# Patient Record
Sex: Male | Born: 1990 | Race: White | Hispanic: No | Marital: Single | Smoking: Never smoker
Health system: Southern US, Community
[De-identification: ages and names within clinical notes are randomized; demographics above are authoritative.]

## PROBLEM LIST (undated history)

## (undated) HISTORY — PX: WISDOM TOOTH EXTRACTION: SHX21

---
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
## 2016-09-28 ENCOUNTER — Emergency Department (HOSPITAL_COMMUNITY)
Admission: EM | Admit: 2016-09-28 | Discharge: 2016-09-28 | Disposition: A | Discharge status: Home / Self Care | Payer: Self-pay | Attending: Emergency Medicine | Admitting: Emergency Medicine

## 2016-09-28 ENCOUNTER — Emergency Department (HOSPITAL_COMMUNITY): Payer: Self-pay

## 2016-09-28 ENCOUNTER — Encounter (HOSPITAL_COMMUNITY): Payer: Self-pay | Admitting: Emergency Medicine

## 2016-09-28 DIAGNOSIS — R1031 Right lower quadrant pain: Secondary | ICD-10-CM | POA: Insufficient documentation

## 2016-09-28 LAB — CBC
HEMATOCRIT: 45.2 % (ref 39.0–52.0)
HEMOGLOBIN: 15.4 g/dL (ref 13.0–17.0)
MCH: 31.2 pg (ref 26.0–34.0)
MCHC: 34.1 g/dL (ref 30.0–36.0)
MCV: 91.5 fL (ref 78.0–100.0)
PLATELETS: 255 10*3/uL (ref 150–400)
RBC: 4.94 MIL/uL (ref 4.22–5.81)
RDW: 12.2 % (ref 11.5–15.5)
WBC: 10.3 10*3/uL (ref 4.0–10.5)

## 2016-09-28 LAB — URINALYSIS, ROUTINE W REFLEX MICROSCOPIC
Bilirubin Urine: NEGATIVE
GLUCOSE, UA: NEGATIVE mg/dL
Hgb urine dipstick: NEGATIVE
Ketones, ur: NEGATIVE mg/dL
LEUKOCYTES UA: NEGATIVE
Nitrite: NEGATIVE
PROTEIN: NEGATIVE mg/dL
SPECIFIC GRAVITY, URINE: 1.012 (ref 1.005–1.030)
pH: 7 (ref 5.0–8.0)

## 2016-09-28 LAB — LIPASE, BLOOD: LIPASE: 26 U/L (ref 11–51)

## 2016-09-28 LAB — COMPREHENSIVE METABOLIC PANEL
ALT: 20 U/L (ref 17–63)
ANION GAP: 9 (ref 5–15)
AST: 19 U/L (ref 15–41)
Albumin: 4.8 g/dL (ref 3.5–5.0)
Alkaline Phosphatase: 76 U/L (ref 38–126)
BUN: 8 mg/dL (ref 6–20)
CHLORIDE: 101 mmol/L (ref 101–111)
CO2: 26 mmol/L (ref 22–32)
Calcium: 9.5 mg/dL (ref 8.9–10.3)
Creatinine, Ser: 0.95 mg/dL (ref 0.61–1.24)
Glucose, Bld: 104 mg/dL — ABNORMAL HIGH (ref 65–99)
POTASSIUM: 4.2 mmol/L (ref 3.5–5.1)
SODIUM: 136 mmol/L (ref 135–145)
Total Bilirubin: 1 mg/dL (ref 0.3–1.2)
Total Protein: 7.8 g/dL (ref 6.5–8.1)

## 2016-09-28 MED ORDER — IOPAMIDOL (ISOVUE-300) INJECTION 61%
INTRAVENOUS | Status: AC
  Administered 2016-09-28: 100 mL
  Filled 2016-09-28: qty 100

## 2016-09-28 MED ORDER — SODIUM CHLORIDE 0.9 % IV BOLUS (SEPSIS)
1000.0000 mL | Freq: Once | INTRAVENOUS | Status: AC
  Administered 2016-09-28: 1000 mL via INTRAVENOUS

## 2016-09-28 NOTE — ED Provider Notes (Signed)
[facility] DEPT Provider Note   CSN: [id number] Arrival date & time: 09/28/16  1638     History   Chief Complaint Chief Complaint  Patient presents with  . Abdominal Pain  . Nausea    HPI Angel Kaiser is a 26 y.o. male.  Patient presents with persistent mild right lower quadrant pain since last night when he developed nausea. Patient so has mild nausea. No abdominal surgery history. No vomiting. No fever. No current urinary or testicular symptoms. No history of similar. Softer stool.      History reviewed. No pertinent past medical history.  There are no active problems to display for this patient.   Past Surgical History:  Procedure Laterality Date  . WISDOM TOOTH EXTRACTION         Home Medications    Prior to Admission medications   Not on File    Family History No family history on file.  Social History Social History  Substance Use Topics  . Smoking status: Never Smoker  . Smokeless tobacco: Never Used  . Alcohol use No     Allergies   Patient has no known allergies.   Review of Systems Review of Systems  Constitutional: Negative for chills and fever.  HENT: Negative for congestion.   Eyes: Negative for visual disturbance.  Respiratory: Negative for shortness of breath.   Cardiovascular: Negative for chest pain.  Gastrointestinal: Positive for abdominal pain and nausea. Negative for vomiting.  Genitourinary: Negative for dysuria and flank pain.  Musculoskeletal: Negative for back pain, neck pain and neck stiffness.  Skin: Negative for rash.  Neurological: Negative for light-headedness and headaches.     Physical Exam Updated Vital Signs BP 130/88   Pulse 92   Temp 98.2 F (36.8 C) (Oral)   Resp 18   Ht 6\' 3"  (1.905 m)   Wt 81.6 kg (180 lb)   SpO2 100%   BMI 22.50 kg/m   Physical Exam  Constitutional: He is oriented to person, place, and time. He appears well-developed and well-nourished.  HENT:  Head: Normocephalic  and atraumatic.  Eyes: Conjunctivae are normal. Right eye exhibits no discharge. Left eye exhibits no discharge.  Neck: Normal range of motion. Neck supple. No tracheal deviation present.  Cardiovascular: Normal rate and regular rhythm.   Pulmonary/Chest: Effort normal and breath sounds normal.  Abdominal: Soft. He exhibits no distension. There is tenderness (mild right lower quadrant no guarding). There is no guarding.  Musculoskeletal: He exhibits no edema.  Neurological: He is alert and oriented to person, place, and time.  Skin: Skin is warm. No rash noted.  Psychiatric: He has a normal mood and affect.  Nursing note and vitals reviewed.    ED Treatments / Results  Labs (all labs ordered are listed, but only abnormal results are displayed) Labs Reviewed  COMPREHENSIVE METABOLIC PANEL - Abnormal; Notable for the following:       Result Value   Glucose, Bld 104 (*)    All other components within normal limits  LIPASE, BLOOD  CBC  URINALYSIS, ROUTINE W REFLEX MICROSCOPIC    EKG  EKG Interpretation None       Radiology Ct Abdomen Pelvis W Contrast  Result Date: 09/28/2016 CLINICAL DATA:  Right lower quadrant pain with nausea and diarrhea. EXAM: CT ABDOMEN AND PELVIS WITH CONTRAST TECHNIQUE: Multidetector CT imaging of the abdomen and pelvis was performed using the standard protocol following bolus administration of intravenous contrast. CONTRAST:  ISOVUE-300 IOPAMIDOL (ISOVUE-300) INJECTION 61% COMPARISON:  None. FINDINGS: Lower chest: No acute abnormality. Hepatobiliary: No focal liver abnormality is seen. No gallstones, gallbladder wall thickening, or biliary dilatation. Pancreas: Unremarkable. No pancreatic ductal dilatation or surrounding inflammatory changes. Spleen: Normal in size without focal abnormality. Adrenals/Urinary Tract: Adrenal glands are unremarkable. Kidneys are normal, without renal calculi, focal lesion, or hydronephrosis. Bladder is unremarkable.  Stomach/Bowel: The stomach and small bowel are normal. The colon is normal. Mid appendix is mildly prominent in caliber measuring 7.3 mm. There is also fluid in the mid appendix. However, there is no adjacent stranding and the distal tip of the appendix is normal. No convincing evidence of appendicitis on today's study. Vascular/Lymphatic: No significant vascular findings are present. No enlarged abdominal or pelvic lymph nodes. Reproductive: Prostate is unremarkable. Other: No free air or free fluid. A fat containing umbilical hernia is identified. Musculoskeletal: No acute or significant osseous findings. IMPRESSION: 1. The mid appendix is mildly prominent in caliber measuring 7.3 mm but there is no adjacent stranding and no convincing evidence of appendicitis. 2. No other acute abnormalities there Electronically Signed   By: Gerome Samavid  Williams III M.D   On: 09/28/2016 19:54    Procedures Procedures (including critical care time)  Medications Ordered in ED Medications  sodium chloride 0.9 % bolus 1,000 mL (1,000 mLs Intravenous New Bag/Given 09/28/16 1905)  iopamidol (ISOVUE-300) 61 % injection (100 mLs  Contrast Given 09/28/16 1925)     Initial Impression / Assessment and Plan / ED Course  I have reviewed the triage vital signs and the nursing notes.  Pertinent labs & imaging results that were available during my care of the patient were reviewed by me and considered in my medical decision making (see chart for details).    Patient presents with right lower quadrant pain and nausea. Concern clinically for early appendicitis. CT scan showed mild dilation of mid appendix. General surgery consult  General surgery has low suspicion for appy, recommends recheck in 24 hrs in ED.  Patient comfortable with plan, he understands this may be early appendicitis.   Results and differential diagnosis were discussed with the patient/parent/guardian. Xrays were independently reviewed by myself.  Close  follow up outpatient was discussed, comfortable with the plan.   Medications  sodium chloride 0.9 % bolus 1,000 mL (0 mLs Intravenous Stopped 09/28/16 2012)  iopamidol (ISOVUE-300) 61 % injection (100 mLs  Contrast Given 09/28/16 1925)    Vitals:   09/28/16 1830 09/28/16 1845 09/28/16 1900 09/28/16 2000  BP: 137/81 126/79 130/88 126/84  Pulse: 100 92 92 94  Resp:   18 16  Temp:      TempSrc:      SpO2: 100% 99% 100% 100%  Weight:      Height:        Final diagnoses:  Right lower quadrant abdominal pain      Final Clinical Impressions(s) / ED Diagnoses   Final diagnoses:  Right lower quadrant abdominal pain    New Prescriptions New Prescriptions   No medications on file     Blane OharaZavitz, Lani, MD 10/01/16 [phone]

## 2016-09-28 NOTE — Discharge Instructions (Signed)
See a physician or return to the emergency department tomorrow for recheck for possible early appendicitis.   If you were given medicines take as directed.  If you are on coumadin or contraceptives realize their levels and effectiveness is altered by many different medicines.  If you have any reaction (rash, tongues swelling, other) to the medicines stop taking and see a physician.    Please follow up as directed and return to the ER or see a physician for new or worsening symptoms.  Thank you. Vitals:   09/28/16 1830 09/28/16 1845 09/28/16 1900 09/28/16 2000  BP: 137/81 126/79 130/88 126/84  Pulse: 100 92 92 94  Resp:   18 16  Temp:      TempSrc:      SpO2: 100% 99% 100% 100%  Weight:      Height:

## 2016-09-28 NOTE — Consult Note (Signed)
Reason for Consult:abd pain, ? appendicitis Referring Physician: Dr Reather Converse     Angel Kaiser is an 26 y.o. male.  HPI: 54 yom who has no medical history presents with onset of rlq pain at 2100 last night. Has some nausea.  Having normal bms although had a loose stool this am. No fevers. It does not hurt with movement and did not hurt in car ride over. Here because his mother made him come tonight.  He is voiding normally. He has no prior medical history.  He complains of some mild tenderness in his rlq.  He underwent a ct scan that is equivocal for appendicitis. There is mildly prominent appendix with some fluid but remainder is normal and there is not adjacent inflammatory process.  His wbc is normal.   History reviewed. No pertinent past medical history.  Past Surgical History:  Procedure Laterality Date  . WISDOM TOOTH EXTRACTION      No family history on file.  Social History:  reports that he has never smoked. He has never used smokeless tobacco. He reports that he does not drink alcohol or use drugs.  Allergies: No Known Allergies  Medications: I have reviewed the patient's current medications.  Results for orders placed or performed during the hospital encounter of 09/28/16 (from the past 48 hour(s))  Lipase, blood     Status: None   Collection Time: 09/28/16  4:51 PM  Result Value Ref Range   Lipase 26 11 - 51 U/L  Comprehensive metabolic panel     Status: Abnormal   Collection Time: 09/28/16  4:51 PM  Result Value Ref Range   Sodium 136 135 - 145 mmol/L   Potassium 4.2 3.5 - 5.1 mmol/L   Chloride 101 101 - 111 mmol/L   CO2 26 22 - 32 mmol/L   Glucose, Bld 104 (H) 65 - 99 mg/dL   BUN 8 6 - 20 mg/dL   Creatinine, Ser 0.95 0.61 - 1.24 mg/dL   Calcium 9.5 8.9 - 10.3 mg/dL   Total Protein 7.8 6.5 - 8.1 g/dL   Albumin 4.8 3.5 - 5.0 g/dL   AST 19 15 - 41 U/L   ALT 20 17 - 63 U/L   Alkaline Phosphatase 76 38 - 126 U/L   Total Bilirubin 1.0 0.3 - 1.2 mg/dL   GFR calc non Af  Amer >60 >60 mL/min   GFR calc Af Amer >60 >60 mL/min    Comment: (NOTE) The eGFR has been calculated using the CKD EPI equation. This calculation has not been validated in all clinical situations. eGFR's persistently <60 mL/min signify possible Chronic Kidney Disease.    Anion gap 9 5 - 15  CBC     Status: None   Collection Time: 09/28/16  4:51 PM  Result Value Ref Range   WBC 10.3 4.0 - 10.5 K/uL   RBC 4.94 4.22 - 5.81 MIL/uL   Hemoglobin 15.4 13.0 - 17.0 g/dL   HCT 45.2 39.0 - 52.0 %   MCV 91.5 78.0 - 100.0 fL   MCH 31.2 26.0 - 34.0 pg   MCHC 34.1 30.0 - 36.0 g/dL   RDW 12.2 11.5 - 15.5 %   Platelets 255 150 - 400 K/uL  Urinalysis, Routine w reflex microscopic     Status: None   Collection Time: 09/28/16  7:07 PM  Result Value Ref Range   Color, Urine YELLOW YELLOW   APPearance CLEAR CLEAR   Specific Gravity, Urine 1.012 1.005 - 1.030   pH  7.0 5.0 - 8.0   Glucose, UA NEGATIVE NEGATIVE mg/dL   Hgb urine dipstick NEGATIVE NEGATIVE   Bilirubin Urine NEGATIVE NEGATIVE   Ketones, ur NEGATIVE NEGATIVE mg/dL   Protein, ur NEGATIVE NEGATIVE mg/dL   Nitrite NEGATIVE NEGATIVE   Leukocytes, UA NEGATIVE NEGATIVE    Ct Abdomen Pelvis W Contrast  Result Date: 09/28/2016 CLINICAL DATA:  Right lower quadrant pain with nausea and diarrhea. EXAM: CT ABDOMEN AND PELVIS WITH CONTRAST TECHNIQUE: Multidetector CT imaging of the abdomen and pelvis was performed using the standard protocol following bolus administration of intravenous contrast. CONTRAST:  172m ISOVUE-300 IOPAMIDOL (ISOVUE-300) INJECTION 61% COMPARISON:  None. FINDINGS: Lower chest: No acute abnormality. Hepatobiliary: No focal liver abnormality is seen. No gallstones, gallbladder wall thickening, or biliary dilatation. Pancreas: Unremarkable. No pancreatic ductal dilatation or surrounding inflammatory changes. Spleen: Normal in size without focal abnormality. Adrenals/Urinary Tract: Adrenal glands are unremarkable. Kidneys are  normal, without renal calculi, focal lesion, or hydronephrosis. Bladder is unremarkable. Stomach/Bowel: The stomach and small bowel are normal. The colon is normal. Mid appendix is mildly prominent in caliber measuring 7.3 mm. There is also fluid in the mid appendix. However, there is no adjacent stranding and the distal tip of the appendix is normal. No convincing evidence of appendicitis on today's study. Vascular/Lymphatic: No significant vascular findings are present. No enlarged abdominal or pelvic lymph nodes. Reproductive: Prostate is unremarkable. Other: No free air or free fluid. A fat containing umbilical hernia is identified. Musculoskeletal: No acute or significant osseous findings. IMPRESSION: 1. The mid appendix is mildly prominent in caliber measuring 7.3 mm but there is no adjacent stranding and no convincing evidence of appendicitis. 2. No other acute abnormalities there Electronically Signed   By: DDorise BullionIII M.D   On: 09/28/2016 19:54    Review of Systems  Constitutional: Negative for chills and fever.  Gastrointestinal: Positive for abdominal pain and diarrhea. Negative for nausea and vomiting.  All other systems reviewed and are negative.  Blood pressure 126/84, pulse 94, temperature 98.2 F (36.8 C), temperature source Oral, resp. rate 16, height 6' 3" (1.905 m), weight 81.6 kg (180 lb), SpO2 100 %. Physical Exam  Vitals reviewed. Constitutional: He is oriented to person, place, and time. He appears well-developed and well-nourished. No distress.  HENT:  Head: Normocephalic and atraumatic.  Eyes: No scleral icterus.  Cardiovascular: Normal rate, regular rhythm and normal heart sounds.   Respiratory: Effort normal. He has no wheezes. He has no rales.  GI: Soft. Bowel sounds are normal. There is tenderness (minimal rlq tenderness to deep palpation).  Neurological: He is alert and oriented to person, place, and time.  Skin: Skin is warm and dry. He is not diaphoretic.     Assessment/Plan: rlq pain  I am not convinced he has appendicitis on exam and on what is really an equivocal ct scan. He has no fever and has normal wbc.  I discussed with he and his mother that he might have appendicitis though.  We discussed options of proceeding to lap appy now with decent chance of normal appendix that I would remove.  Other option given that he is just 24 hours into this is a recheck in am in the er.  I think that is reasonable given his overall picture and dont think at that point he would really risk rupture less than 36 hours into this if we proceed with that.  He is not really interested in taking risk of a negative surgery at  this point and I am not again convinced on exam he has appy.  We will plan on recheck in am by er.  I discussed fevers, worsening pain as reasons to return overnight tonight also.  Discussed plan with Dr Reather Converse.   Angel Kaiser,Angel Kaiser 09/28/2016, 8:51 PM

## 2016-09-28 NOTE — ED Notes (Signed)
Dr. Wakefield at bedside. 

## 2016-09-28 NOTE — ED Triage Notes (Signed)
Pt c/o abdominal pain with nausea onset last night at 9 pm. Pt denies change in diet.

## 2016-09-29 ENCOUNTER — Encounter (HOSPITAL_COMMUNITY): Payer: Self-pay

## 2016-09-29 ENCOUNTER — Emergency Department (HOSPITAL_COMMUNITY)
Admission: EM | Admit: 2016-09-29 | Discharge: 2016-09-29 | Disposition: A | Discharge status: Home / Self Care | Payer: Self-pay | Attending: Emergency Medicine | Admitting: Emergency Medicine

## 2016-09-29 ENCOUNTER — Emergency Department (HOSPITAL_COMMUNITY): Payer: Self-pay

## 2016-09-29 DIAGNOSIS — R1031 Right lower quadrant pain: Secondary | ICD-10-CM | POA: Insufficient documentation

## 2016-09-29 DIAGNOSIS — Z79899 Other long term (current) drug therapy: Secondary | ICD-10-CM | POA: Insufficient documentation

## 2016-09-29 LAB — COMPREHENSIVE METABOLIC PANEL
ALT: 22 U/L (ref 17–63)
ANION GAP: 10 (ref 5–15)
AST: 22 U/L (ref 15–41)
Albumin: 4.6 g/dL (ref 3.5–5.0)
Alkaline Phosphatase: 68 U/L (ref 38–126)
BUN: 9 mg/dL (ref 6–20)
CHLORIDE: 105 mmol/L (ref 101–111)
CO2: 24 mmol/L (ref 22–32)
Calcium: 9.7 mg/dL (ref 8.9–10.3)
Creatinine, Ser: 0.88 mg/dL (ref 0.61–1.24)
Glucose, Bld: 90 mg/dL (ref 65–99)
POTASSIUM: 4 mmol/L (ref 3.5–5.1)
SODIUM: 139 mmol/L (ref 135–145)
Total Bilirubin: 1 mg/dL (ref 0.3–1.2)
Total Protein: 7.9 g/dL (ref 6.5–8.1)

## 2016-09-29 LAB — CBC
HCT: 43.5 % (ref 39.0–52.0)
HEMOGLOBIN: 15.3 g/dL (ref 13.0–17.0)
MCH: 32.1 pg (ref 26.0–34.0)
MCHC: 35.2 g/dL (ref 30.0–36.0)
MCV: 91.4 fL (ref 78.0–100.0)
Platelets: 255 10*3/uL (ref 150–400)
RBC: 4.76 MIL/uL (ref 4.22–5.81)
RDW: 12.1 % (ref 11.5–15.5)
WBC: 7.9 10*3/uL (ref 4.0–10.5)

## 2016-09-29 LAB — LIPASE, BLOOD: LIPASE: 28 U/L (ref 11–51)

## 2016-09-29 MED ORDER — IOPAMIDOL (ISOVUE-300) INJECTION 61%
INTRAVENOUS | Status: AC
  Filled 2016-09-29: qty 100

## 2016-09-29 MED ORDER — IOPAMIDOL (ISOVUE-300) INJECTION 61%
INTRAVENOUS | Status: AC
  Administered 2016-09-29: 100 mL
  Filled 2016-09-29: qty 30

## 2016-09-29 NOTE — ED Provider Notes (Signed)
[facility] Provider Note   CSN: [id number] Arrival date & time: 09/29/16  1707     History   Chief Complaint Chief Complaint  Patient presents with  . Nausea    HPI Angel Kaiser is a 26 y.o. male. Without significant past medical history of present persistent steady right lower quadrant abdominal pain began on Friday. He states pain is throbbing in 3/10 at rest, worse with moving his legs becoming intermittently sharp and rated 5/10. He has associated nonbloody diarrhea 1 today, chills, decreased appetite, and intermittent nausea. He took Tylenol with relief of nausea and chills, however did not relieve right lower quadrant abdominal pain.  He was seen yesterday in ED for the same complaint, where CT of the abdomen showed mildly prominent mid appendix, though without inflammatory changes . Dr. Dwain SarnaWakefield with general surgery was consulted; recommended patient return in 24 hours for recheck. Denies urinary symptoms, fever, headache, chest pain, shortness of breath, or any other complaints. HPI  History reviewed. No pertinent past medical history.  There are no active problems to display for this patient.   Past Surgical History:  Procedure Laterality Date  . WISDOM TOOTH EXTRACTION         Home Medications    Prior to Admission medications   Medication Sig Start Date End Date Taking? Authorizing Provider  acetaminophen (TYLENOL) 500 MG tablet Take 500 mg by mouth every 6 (six) hours as needed for headache (pain).   Yes [provider]  Multiple Vitamin (MULTIVITAMIN WITH MINERALS) TABS tablet Take 1 tablet by mouth daily. One a Day Men's   Yes [provider]    Family History No family history on file.  Social History Social History  Substance Use Topics  . Smoking status: Never Smoker  . Smokeless tobacco: Never Used  . Alcohol use No     Allergies   Patient has no known allergies.   Review of Systems Review of Systems    Constitutional: Positive for appetite change and chills. Negative for fever.  HENT: Negative for trouble swallowing.   Respiratory: Negative for shortness of breath.   Cardiovascular: Negative for chest pain.  Gastrointestinal: Positive for abdominal pain (RLQ), diarrhea and nausea. Negative for blood in stool, constipation and vomiting.  Genitourinary: Negative for dysuria and frequency.  Musculoskeletal: Negative for back pain.  Skin: Negative for color change.  Allergic/Immunologic: Negative for immunocompromised state.  Neurological: Negative for headaches.     Physical Exam Updated Vital Signs BP 122/65 (BP Location: Left Arm)   Pulse 88   Temp 99 F (37.2 C) (Oral)   Resp 16   SpO2 99%   Physical Exam  Constitutional: He appears well-developed and well-nourished.  HENT:  Head: Normocephalic and atraumatic.  Eyes: Conjunctivae are normal.  Pulmonary/Chest: Effort normal.  Abdominal: Soft. Normal appearance and bowel sounds are normal. There is tenderness in the right lower quadrant. There is tenderness at McBurney's point. There is no rigidity, no rebound, no guarding and no CVA tenderness.  Neurological: He is alert.  Skin: Skin is warm.  Psychiatric: He has a normal mood and affect. His behavior is normal.  Nursing note and vitals reviewed.    ED Treatments / Results  Labs (all labs ordered are listed, but only abnormal results are displayed) Labs Reviewed  LIPASE, BLOOD  COMPREHENSIVE METABOLIC PANEL  CBC  URINALYSIS, ROUTINE W REFLEX MICROSCOPIC    EKG  EKG Interpretation None       Radiology Ct Abdomen Pelvis W  Contrast  Result Date: 09/29/2016 CLINICAL DATA:  Right lower quadrant abdominal pain. EXAM: CT ABDOMEN AND PELVIS WITH CONTRAST TECHNIQUE: Multidetector CT imaging of the abdomen and pelvis was performed using the standard protocol following bolus administration of intravenous contrast. CONTRAST:  ISOVUE-300 IOPAMIDOL (ISOVUE-300)  INJECTION 61% COMPARISON:  CT yesterday at 1935 hour FINDINGS: Lower chest: Lung bases are clear. Hepatobiliary: No focal hepatic lesion. Gallbladder physiologically distended, no calcified stone. No biliary dilatation. Pancreas: No ductal dilatation or inflammation. Spleen: Normal in size without focal abnormality. Adrenals/Urinary Tract: Normal adrenal glands. No hydronephrosis or perinephric edema. Homogeneous renal enhancement. Ureters are decompressed. Urinary bladder is physiologically distended, no bladder wall thickening. Stomach/Bowel: Stable appearance of the appendix from prior exam, remaining 7-8 mm in the midportion. No progressive periappendiceal soft tissue stranding or inflammation. Stomach is physiologically distended. No small bowel dilatation or inflammation. Terminal ileum is normal. No obstruction, there is enteric contrast in the colon. No colonic wall thickening or inflammation. Vascular/Lymphatic: No significant vascular findings are present. No enlarged abdominal or pelvic lymph nodes. Reproductive: Prostate is unremarkable. Other: No abscess or free fluid. No free air. Tiny fat containing umbilical hernia. Musculoskeletal: There are no acute or suspicious osseous abnormalities. IMPRESSION: 1. Stable appearance of the appendix, prominent caliber measuring 7-8 mm. No associated inflammation. 2. The exam is otherwise unchanged.  No new abnormality. Electronically Signed   By: Rubye Oaks M.D.   On: 09/29/2016 21:58   Ct Abdomen Pelvis W Contrast  Result Date: 09/28/2016 CLINICAL DATA:  Right lower quadrant pain with nausea and diarrhea. EXAM: CT ABDOMEN AND PELVIS WITH CONTRAST TECHNIQUE: Multidetector CT imaging of the abdomen and pelvis was performed using the standard protocol following bolus administration of intravenous contrast. CONTRAST:  ISOVUE-300 IOPAMIDOL (ISOVUE-300) INJECTION 61% COMPARISON:  None. FINDINGS: Lower chest: No acute abnormality. Hepatobiliary: No focal  liver abnormality is seen. No gallstones, gallbladder wall thickening, or biliary dilatation. Pancreas: Unremarkable. No pancreatic ductal dilatation or surrounding inflammatory changes. Spleen: Normal in size without focal abnormality. Adrenals/Urinary Tract: Adrenal glands are unremarkable. Kidneys are normal, without renal calculi, focal lesion, or hydronephrosis. Bladder is unremarkable. Stomach/Bowel: The stomach and small bowel are normal. The colon is normal. Mid appendix is mildly prominent in caliber measuring 7.3 mm. There is also fluid in the mid appendix. However, there is no adjacent stranding and the distal tip of the appendix is normal. No convincing evidence of appendicitis on today's study. Vascular/Lymphatic: No significant vascular findings are present. No enlarged abdominal or pelvic lymph nodes. Reproductive: Prostate is unremarkable. Other: No free air or free fluid. A fat containing umbilical hernia is identified. Musculoskeletal: No acute or significant osseous findings. IMPRESSION: 1. The mid appendix is mildly prominent in caliber measuring 7.3 mm but there is no adjacent stranding and no convincing evidence of appendicitis. 2. No other acute abnormalities there Electronically Signed   By: Gerome Sam III M.D   On: 09/28/2016 19:54    Procedures Procedures (including critical care time)  Medications Ordered in ED Medications  iopamidol (ISOVUE-300) 61 % injection (not administered)  iopamidol (ISOVUE-300) 61 % injection (100 mLs  Contrast Given 09/29/16 2128)     Initial Impression / Assessment and Plan / ED Course  I have reviewed the triage vital signs and the nursing notes.  Pertinent labs & imaging results that were available during my care of the patient were reviewed by me and considered in my medical decision making (see chart for details).  Pt w RLQ abdominal pain on repeat CT. Patient seen yesterday in ED for same complaint, with CT showing a dilated  appendix without inflammation. Patient seen by general surgery, Dr. Dwain Sarna, who recommended patient return in 24 hours for recheck. CT today with stable appearance of appendix, without inflammatory changes. Patient is nontoxic, nonseptic appearing, in no apparent distress.  Patient's pain and other symptoms adequately managed in emergency department.   Labs, imaging and vitals reviewed. White blood cell count with improvement from yesterday, remainder of labs are unremarkable.  Patient does not meet the SIRS or Sepsis criteria.  On repeat exam patient does not have a surgical abdomen and there are no peritoneal signs.  Patient and his family stating they were told to return today for recheck as this was appendicitis. Explained CT findings, improved lab results, provided reassurance to patient and discussed strict return precautions. Pt verbalizes understanding to return to ER if pain worsens. Patient is to follow-up with his PCP if symptoms persist. Patient discharged home with symptomatic treatment. Pt safe for discharge.  Patient discussed with and seen by Dr. Deretha Emory.  Discussed results, findings, treatment and follow up. Patient advised of return precautions. Patient verbalized understanding and agreed with plan.  Final Clinical Impressions(s) / ED Diagnoses   Final diagnoses:  RLQ abdominal pain    New Prescriptions Discharge Medication List as of 09/29/2016 10:52 PM       Russo, [place] N, PA-C 09/29/16 2348    Vanetta Mulders, MD 09/30/16 [phone]

## 2016-09-29 NOTE — ED Notes (Signed)
Pt returned from CT °

## 2016-09-29 NOTE — Discharge Instructions (Signed)
You can take advil or tylenol as needed for pain. Follow up with your primary care provider as needed for persistent symptoms. Return to the ER if pain becomes worse, or for new or concerning symptoms.

## 2016-09-29 NOTE — ED Triage Notes (Addendum)
Pt reports nausea and RLQ abdominal pain that started Friday night. He was seen here yesterday for this pain and CT revealed possible appendicitis but Dr. Dwain SarnaWakefield discharged patient home due to possibility of it not being an appendicitis. He told patient to come back to ER today if pain and nausea persists. Denies current pain, only when moving. No vomiting.

## 2016-09-29 NOTE — ED Notes (Signed)
ED Provider at bedside. 

## 2016-09-29 NOTE — ED Provider Notes (Signed)
Medical screening examination/treatment/procedure(s) were conducted as a shared visit with non-physician practitioner(s) and myself.  I personally evaluated the patient during the encounter.   EKG Interpretation None       Results for orders placed or performed during the hospital encounter of 09/29/16  Lipase, blood  Result Value Ref Range   Lipase 28 11 - 51 U/L  Comprehensive metabolic panel  Result Value Ref Range   Sodium 139 135 - 145 mmol/L   Potassium 4.0 3.5 - 5.1 mmol/L   Chloride 105 101 - 111 mmol/L   CO2 24 22 - 32 mmol/L   Glucose, Bld 90 65 - 99 mg/dL   BUN 9 6 - 20 mg/dL   Creatinine, Ser 1.610.88 0.61 - 1.24 mg/dL   Calcium 9.7 8.9 - 09.610.3 mg/dL   Total Protein 7.9 6.5 - 8.1 g/dL   Albumin 4.6 3.5 - 5.0 g/dL   AST 22 15 - 41 U/L   ALT 22 17 - 63 U/L   Alkaline Phosphatase 68 38 - 126 U/L   Total Bilirubin 1.0 0.3 - 1.2 mg/dL   GFR calc non Af Amer >60 >60 mL/min   GFR calc Af Amer >60 >60 mL/min   Anion gap 10 5 - 15  CBC  Result Value Ref Range   WBC 7.9 4.0 - 10.5 K/uL   RBC 4.76 4.22 - 5.81 MIL/uL   Hemoglobin 15.3 13.0 - 17.0 g/dL   HCT 04.543.5 40.939.0 - 81.152.0 %   MCV 91.4 78.0 - 100.0 fL   MCH 32.1 26.0 - 34.0 pg   MCHC 35.2 30.0 - 36.0 g/dL   RDW 91.412.1 78.211.5 - 95.615.5 %   Platelets 255 150 - 400 K/uL   Ct Abdomen Pelvis W Contrast  Result Date: 09/29/2016 CLINICAL DATA:  Right lower quadrant abdominal pain. EXAM: CT ABDOMEN AND PELVIS WITH CONTRAST TECHNIQUE: Multidetector CT imaging of the abdomen and pelvis was performed using the standard protocol following bolus administration of intravenous contrast. CONTRAST:  100mL ISOVUE-300 IOPAMIDOL (ISOVUE-300) INJECTION 61% COMPARISON:  CT yesterday at 1935 hour FINDINGS: Lower chest: Lung bases are clear. Hepatobiliary: No focal hepatic lesion. Gallbladder physiologically distended, no calcified stone. No biliary dilatation. Pancreas: No ductal dilatation or inflammation. Spleen: Normal in size without focal abnormality.  Adrenals/Urinary Tract: Normal adrenal glands. No hydronephrosis or perinephric edema. Homogeneous renal enhancement. Ureters are decompressed. Urinary bladder is physiologically distended, no bladder wall thickening. Stomach/Bowel: Stable appearance of the appendix from prior exam, remaining 7-8 mm in the midportion. No progressive periappendiceal soft tissue stranding or inflammation. Stomach is physiologically distended. No small bowel dilatation or inflammation. Terminal ileum is normal. No obstruction, there is enteric contrast in the colon. No colonic wall thickening or inflammation. Vascular/Lymphatic: No significant vascular findings are present. No enlarged abdominal or pelvic lymph nodes. Reproductive: Prostate is unremarkable. Other: No abscess or free fluid. No free air. Tiny fat containing umbilical hernia. Musculoskeletal: There are no acute or suspicious osseous abnormalities. IMPRESSION: 1. Stable appearance of the appendix, prominent caliber measuring 7-8 mm. No associated inflammation. 2. The exam is otherwise unchanged.  No new abnormality. Electronically Signed   By: Rubye OaksMelanie  Ehinger M.D.   On: 09/29/2016 21:58   Ct Abdomen Pelvis W Contrast  Result Date: 09/28/2016 CLINICAL DATA:  Right lower quadrant pain with nausea and diarrhea. EXAM: CT ABDOMEN AND PELVIS WITH CONTRAST TECHNIQUE: Multidetector CT imaging of the abdomen and pelvis was performed using the standard protocol following bolus administration of intravenous contrast. CONTRAST:  ISOVUE-300 IOPAMIDOL (ISOVUE-300) INJECTION 61% COMPARISON:  None. FINDINGS: Lower chest: No acute abnormality. Hepatobiliary: No focal liver abnormality is seen. No gallstones, gallbladder wall thickening, or biliary dilatation. Pancreas: Unremarkable. No pancreatic ductal dilatation or surrounding inflammatory changes. Spleen: Normal in size without focal abnormality. Adrenals/Urinary Tract: Adrenal glands are unremarkable. Kidneys are normal,  without renal calculi, focal lesion, or hydronephrosis. Bladder is unremarkable. Stomach/Bowel: The stomach and small bowel are normal. The colon is normal. Mid appendix is mildly prominent in caliber measuring 7.3 mm. There is also fluid in the mid appendix. However, there is no adjacent stranding and the distal tip of the appendix is normal. No convincing evidence of appendicitis on today's study. Vascular/Lymphatic: No significant vascular findings are present. No enlarged abdominal or pelvic lymph nodes. Reproductive: Prostate is unremarkable. Other: No free air or free fluid. A fat containing umbilical hernia is identified. Musculoskeletal: No acute or significant osseous findings. IMPRESSION: 1. The mid appendix is mildly prominent in caliber measuring 7.3 mm but there is no adjacent stranding and no convincing evidence of appendicitis. 2. No other acute abnormalities there Electronically Signed   By: Gerome Sam III M.D   On: 09/28/2016 19:54     Patient was evaluated yesterday for concerns for possible appendicitis. Had right were quadrant pain and had a CT scan done with IV contrast only that showed an enlarged appendix. Patient with persistent pain but not worse. White blood cell count improved today. CT scan was repeated it shows no change in the appendix more important is no signs of any inflammation around the appendix. Based on the 24 hour interval this is not consistent with appendicitis. Patient will be stable for discharge home. Patient does have some tenderness right lower quadrant but has no guarding. Patient will be instructed that if he gets significantly worse to return for repeat evaluation otherwise he does not need to return.   Vanetta Mulders, MD 09/29/16 2234

## 2018-04-21 IMAGING — CT CT ABD-PELV W/ CM
2 of 4 series · 16 of 46 positions shown, 18 images · IV contrast (iopamidol)
Comparison: CT yesterday at 1774 hour

CLINICAL DATA: Right lower quadrant abdominal pain.

EXAM:
CT ABDOMEN AND PELVIS WITH CONTRAST
TECHNIQUE: Multidetector CT imaging of the abdomen and pelvis was performed
using the standard protocol following bolus administration of
intravenous contrast.
CONTRAST:  100mL T76F5I-YTT IOPAMIDOL (T76F5I-YTT) INJECTION 61%

[Series 3: abd/ pelvis 5.0 i30f 2 · axial · 0.80mm/px · z∈[+794,+1234]mm · 13 of 96 slices shown, 15 images]
[im 4/96  soft-tissue]
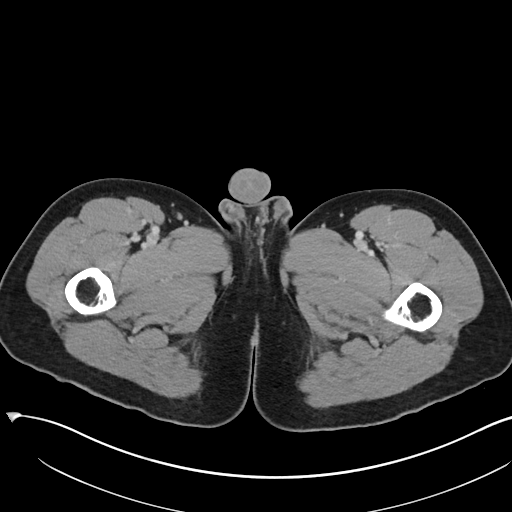
[im 4/96  bone]
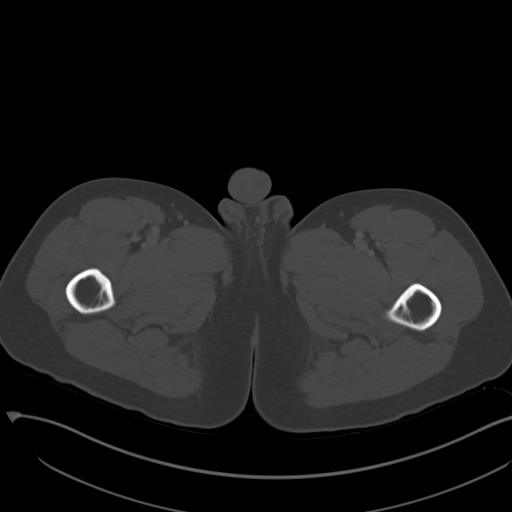
[im 12/96  soft-tissue]
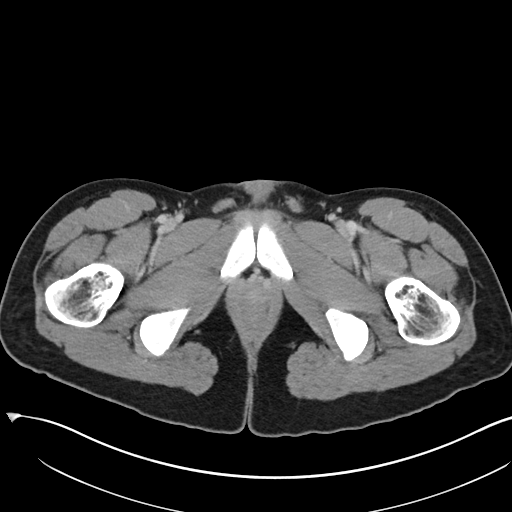
[im 20/96  soft-tissue]
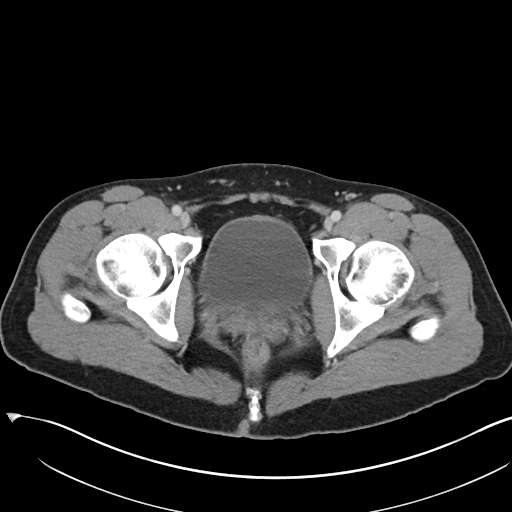
[im 27/96  soft-tissue]
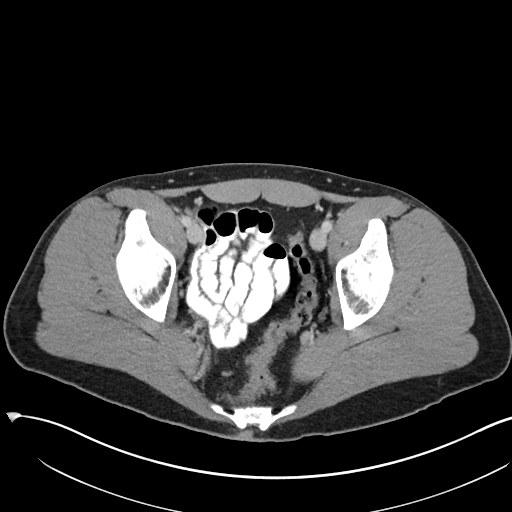
[im 35/96  soft-tissue]
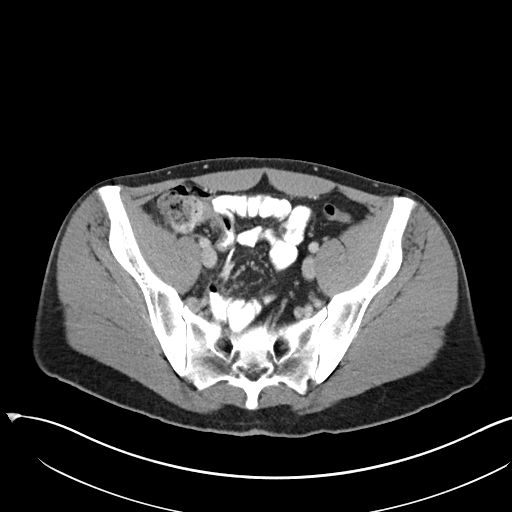
[im 42/96  soft-tissue]
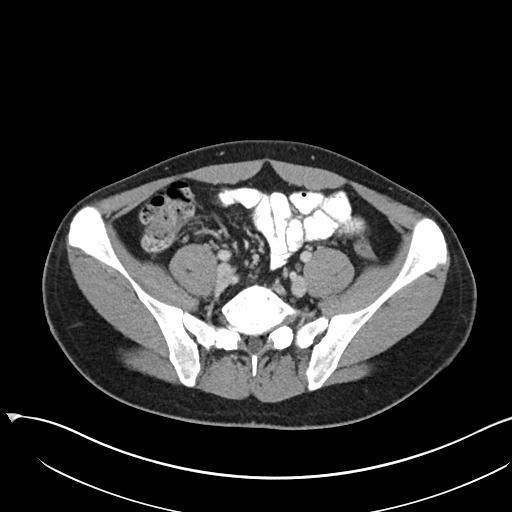
[im 50/96  soft-tissue]
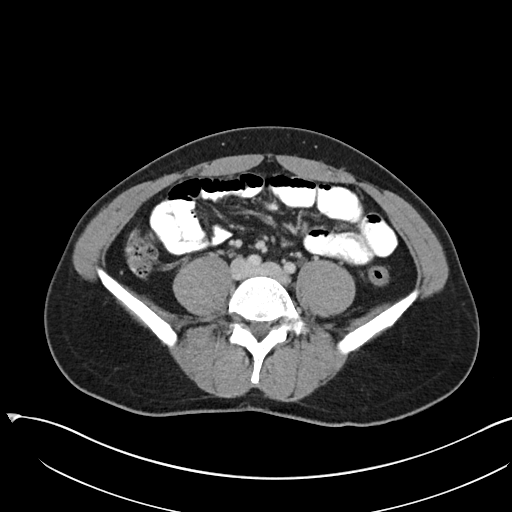
[im 54/96  soft-tissue]
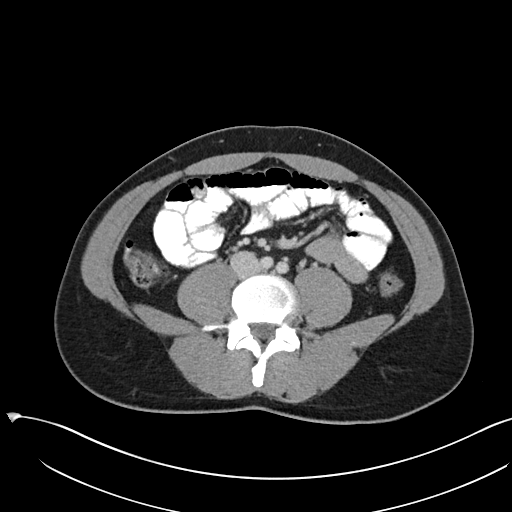
[im 61/96  soft-tissue]
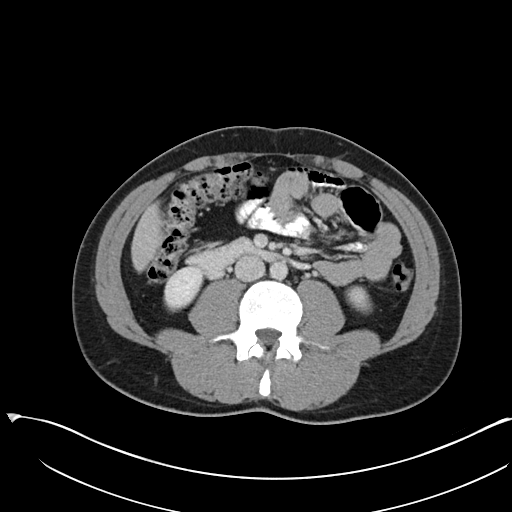
[im 61/96  bone]
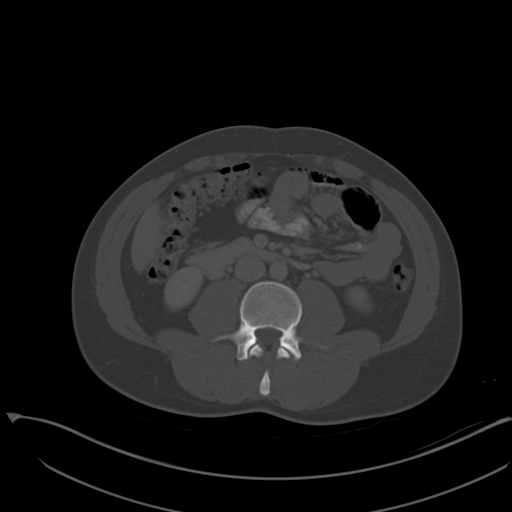
[im 69/96  soft-tissue]
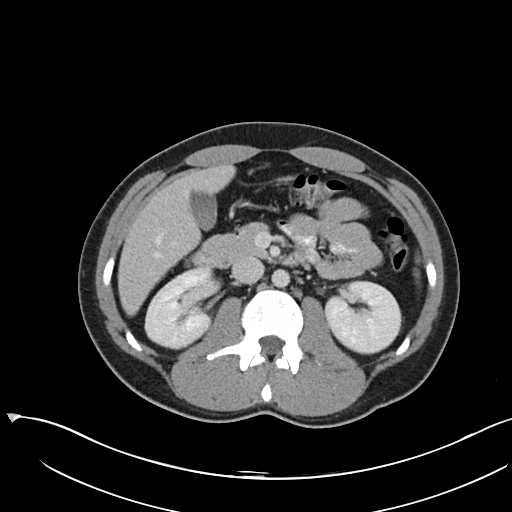
[im 77/96  soft-tissue]
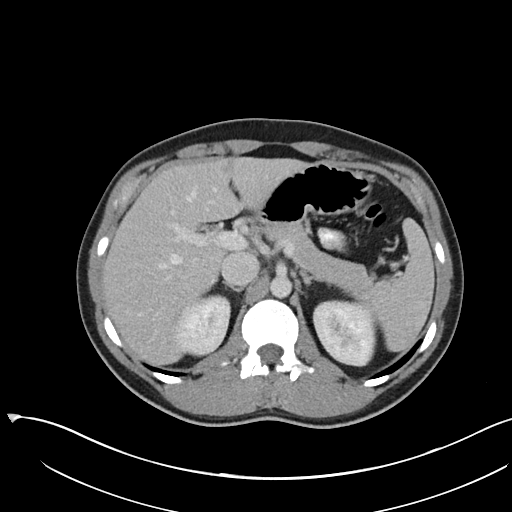
[im 84/96  soft-tissue]
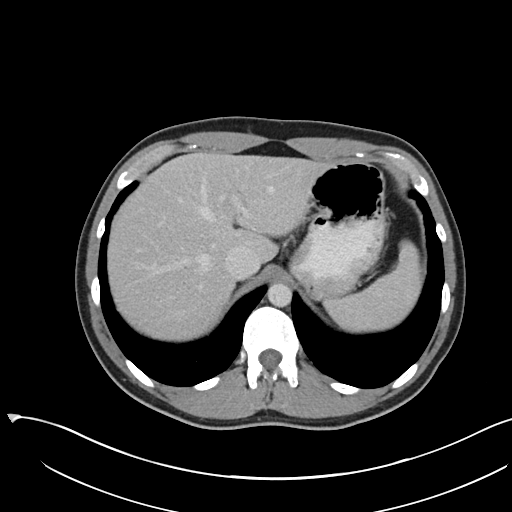
[im 92/96  soft-tissue]
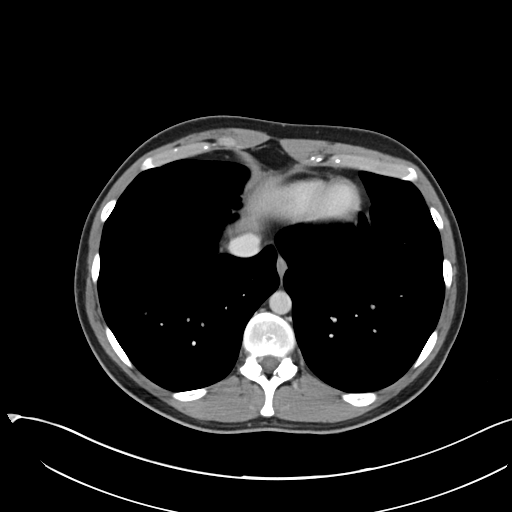

[Series 6: coronal soft tissue · coronal · 0.73mm/px · 3 of 87 slices shown]
[im 29/87  soft-tissue]
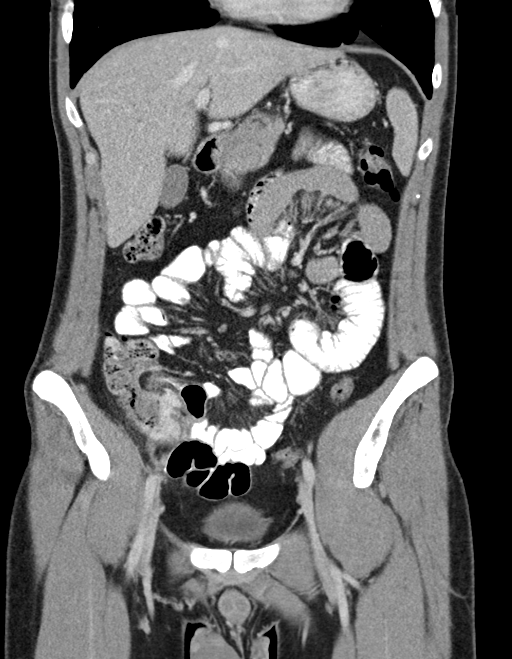
[im 39/87  soft-tissue]
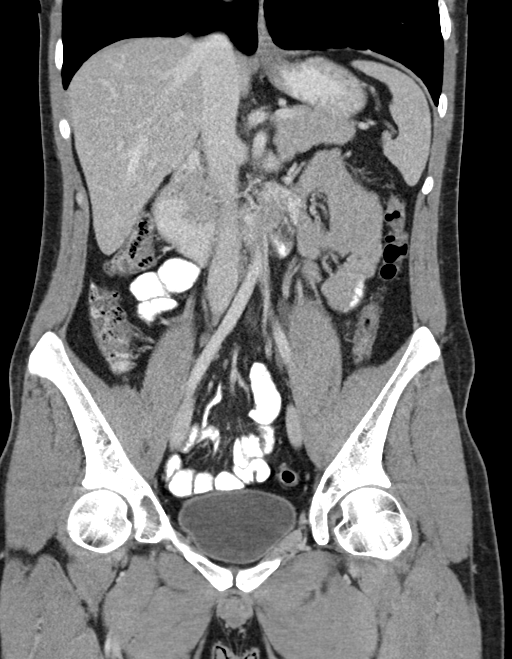
[im 48/87  soft-tissue]
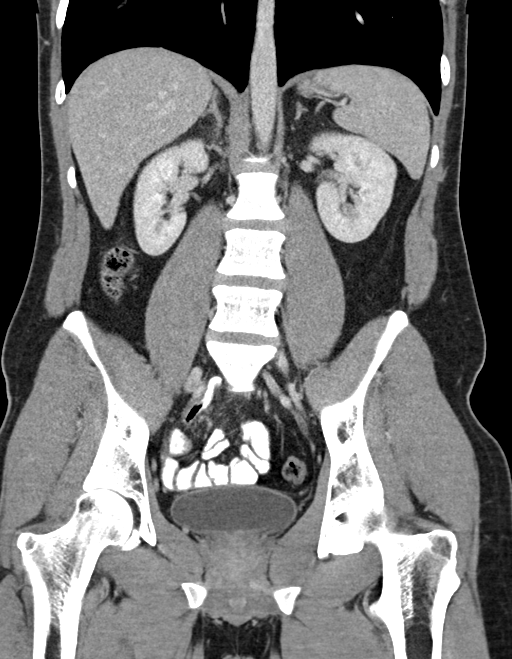

[16 of 46 positions shown; findings below may reference images not displayed]

FINDINGS: Lower chest: Lung bases are clear.

Hepatobiliary: No focal hepatic lesion. Gallbladder physiologically
distended, no calcified stone. No biliary dilatation.

Pancreas: No ductal dilatation or inflammation.

Spleen: Normal in size without focal abnormality.

Adrenals/Urinary Tract: Normal adrenal glands. No hydronephrosis or
perinephric edema. Homogeneous renal enhancement. Ureters are
decompressed. Urinary bladder is physiologically distended, no
bladder wall thickening.

Stomach/Bowel: Stable appearance of the appendix from prior exam,
remaining 7-8 mm in the midportion. No progressive periappendiceal
soft tissue stranding or inflammation. Stomach is physiologically
distended. No small bowel dilatation or inflammation. Terminal ileum
is normal. No obstruction, there is enteric contrast in the colon.
No colonic wall thickening or inflammation.

Vascular/Lymphatic: No significant vascular findings are present. No
enlarged abdominal or pelvic lymph nodes.

Reproductive: Prostate is unremarkable.

Other: No abscess or free fluid. No free air. Tiny fat containing
umbilical hernia.

Musculoskeletal: There are no acute or suspicious osseous
abnormalities.
IMPRESSION: 1. Stable appearance of the appendix, prominent caliber measuring
7-8 mm. No associated inflammation.
2. The exam is otherwise unchanged.  No new abnormality.

## 2019-11-01 ENCOUNTER — Encounter: Payer: Self-pay | Admitting: Physician Assistant

## 2019-11-01 ENCOUNTER — Ambulatory Visit (INDEPENDENT_AMBULATORY_CARE_PROVIDER_SITE_OTHER): Payer: Managed Care, Other (non HMO) | Admitting: Physician Assistant

## 2019-11-01 ENCOUNTER — Ambulatory Visit (INDEPENDENT_AMBULATORY_CARE_PROVIDER_SITE_OTHER): Payer: Managed Care, Other (non HMO)

## 2019-11-01 ENCOUNTER — Other Ambulatory Visit: Payer: Self-pay

## 2019-11-01 DIAGNOSIS — M25531 Pain in right wrist: Secondary | ICD-10-CM | POA: Diagnosis not present

## 2019-11-01 DIAGNOSIS — G8929 Other chronic pain: Secondary | ICD-10-CM | POA: Diagnosis not present

## 2019-11-01 DIAGNOSIS — M5440 Lumbago with sciatica, unspecified side: Secondary | ICD-10-CM | POA: Diagnosis not present

## 2019-11-01 MED ORDER — METHYLPREDNISOLONE 4 MG PO TABS: ORAL_TABLET | ORAL | 0 refills | Status: AC

## 2019-11-01 NOTE — Progress Notes (Signed)
Office Visit Note    Patient: Angel Kaiser           Date of Birth: [birth year]           MRN: [id number] Visit Date: 11/01/2019              Requested by: No referring provider defined for this encounter. PCP: Patient, No Pcp Per   Assessment & Plan: Visit Diagnoses:  1. Pain in right wrist   2. Chronic bilateral low back pain with sciatica, sciatica laterality unspecified     Plan: We will send him to formal physical therapy for a one-time visit to gain at home exercise program mainly to work on gastrocsoleus and hamstring stretching.  He is placed on a Medrol Dosepak he will take this for the next 6 days and then once he has completed it go on 400 mg of ibuprofen twice daily for 2 weeks.  He will wear his wrist splint that he has at home particularly when at work.  He is given a note stating that he needs to wear this at work.  See him back in 4 weeks to see how he is doing in regards to his back and wrist.  Questions were encouraged and answered.  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  Would not recommend any kind of surgical intervention for this.  Follow-Up Instructions: Return in about 4 weeks (around 11/29/2019), or Dr. Magnus Ivan.   Orders:  Orders Placed This Encounter  Procedures  . XR Wrist Complete Right  . XR Lumbar Spine 2-3 Views   Meds ordered this encounter  Medications  . methylPREDNISolone (MEDROL) 4 MG tablet    Sig: Take as directed    Dispense:  21 tablet    Refill:  0      Procedures: No procedures performed   Clinical Data: No additional findings.   Subjective: Chief Complaint  Patient presents with  . Right Wrist - Pain  . Lower Back - Pain    HPI Angel Kaiser is a 29 year old male comes in today for right wrist popping and pain.  Pain is worse with extension motions over  lifting heavy objects.  Has been ongoing since last summer.  No particular injury.  States pain 6 out of 10 pain at worst.  Does work at [employer] heavy boxes at work and this seems to aggravate the wrist.  He has had no real treatment for this.  He has recently started using a wrist splint.  Is also having low back pain that is been ongoing since he was a teenager.  Pain has been worse since March whenever he was tilling the garden.  He denies any bowel bladder dysfunction, waking pain saddle anesthesia like symptoms.  He has some pain that radiates into his right buttocks and also his left inner thigh.  Review of Systems No fevers, chills or shortness of breath.  Objective: Vital Signs: There were no vitals taken for this visit.  Physical Exam Constitutional:      Appearance: He is not ill-appearing or diaphoretic.  Pulmonary:     Effort: Pulmonary effort is normal.  Neurological:     Mental Status: He is alert and oriented to person, place, and time.  Psychiatric:        Mood and Affect:  Mood normal.     Ortho Exam Bilateral wrist full range of motion without pain.  No gross deformity of either wrist.  No pain with ulnar or radial deviation.  He has full supination pronation of bilateral forearms.  Sensation grossly intact throughout bilateral hands.  Radial pulses are 2+ bilaterally.  Nontender over the first extensor compartment of the right wrist. Lower extremities negative straight leg raise bilaterally however considerably tight hamstrings bilaterally.  He also has exquisitely tight gastrocs bilaterally.  Pes planus deformity acquired bilaterally.  Bilateral feet sensation grossly intact.  5 out of 5 strength throughout the lower extremities against resistance.  Nontender over the lumbar spine and paraspinous region of the lower lumbar spine bilaterally. Specialty Comments:  No specialty comments available.  Imaging: XR Wrist Complete Right  Result Date: 11/01/2019 Right  wrist 3 views: No acute findings.  No subluxation dislocation of the carpal bones.  No acute fractures.  Bone density appears normal.  XR Lumbar Spine 2-3 Views  Result Date: 11/01/2019 Lumbar spine 2 views: No acute fractures.  Disc space overall well-maintained.  Slight loss of lordotic curvature.  Slight scoliosis.  Otherwise no bony abnormalities.    PMFS History: Patient Active Problem List   Diagnosis Date Noted  . Chronic right-sided low back pain without sciatica 11/01/2019   History reviewed. No pertinent past medical history.  History reviewed. No pertinent family history.  Past Surgical History:  Procedure Laterality Date  . WISDOM TOOTH EXTRACTION     Social History   Occupational History  . Not on file  Tobacco Use  . Smoking status: Never Smoker  . Smokeless tobacco: Never Used  Substance and Sexual Activity  . Alcohol use: No  . Drug use: No  . Sexual activity: Not on file

## 2021-04-21 ENCOUNTER — Other Ambulatory Visit: Payer: Self-pay

## 2021-04-21 ENCOUNTER — Encounter (HOSPITAL_COMMUNITY): Payer: Self-pay | Admitting: *Deleted

## 2021-04-21 ENCOUNTER — Ambulatory Visit (HOSPITAL_COMMUNITY)
Admission: EM | Admit: 2021-04-21 | Discharge: 2021-04-21 | Disposition: A | Discharge status: Home / Self Care | Payer: Managed Care, Other (non HMO) | Attending: Internal Medicine | Admitting: Internal Medicine

## 2021-04-21 DIAGNOSIS — S0501XA Injury of conjunctiva and corneal abrasion without foreign body, right eye, initial encounter: Secondary | ICD-10-CM

## 2021-04-21 MED ORDER — TETRACAINE HCL 0.5 % OP SOLN
OPHTHALMIC | Status: AC
  Filled 2021-04-21: qty 4

## 2021-04-21 MED ORDER — FLUORESCEIN SODIUM 1 MG OP STRP
ORAL_STRIP | OPHTHALMIC | Status: AC
  Filled 2021-04-21: qty 1

## 2021-04-21 NOTE — ED Triage Notes (Signed)
Today Pt was under his house running a cable .ile and something fell in his eye.

## 2021-04-21 NOTE — ED Provider Notes (Signed)
[facility]    CSN: [id number] Arrival date & time: 04/21/21  1751      History   Chief Complaint Chief Complaint  Patient presents with   Eye Problem    HPI Angel Kaiser is a 31 y.o. male.   HPI  Eye Problme: Just shortly before he arrived to our office patient was under his house trying to wire something when he got a small tan foreign body in his right eye.  Immediately had foreign body sensation along with some eye discomfort.  No known metal exposure.  He tried washing the area out which helped some.  No changes in vision.  History reviewed. No pertinent past medical history.  Patient Active Problem List   Diagnosis Date Noted   Chronic right-sided low back pain without sciatica 11/01/2019    Past Surgical History:  Procedure Laterality Date   WISDOM TOOTH EXTRACTION         Home Medications    Prior to Admission medications   Medication Sig Start Date End Date Taking? Authorizing Provider  acetaminophen (TYLENOL) 500 MG tablet Take 500 mg by mouth every 6 (six) hours as needed for headache (pain).    [provider]  methylPREDNISolone (MEDROL) 4 MG tablet Take as directed 11/01/19   Kirtland Bouchard, PA-C  Multiple Vitamin (MULTIVITAMIN WITH MINERALS) TABS tablet Take 1 tablet by mouth daily. One a Day Men's    [provider]    Family History History reviewed. No pertinent family history.  Social History Social History   Tobacco Use   Smoking status: Never   Smokeless tobacco: Never  Substance Use Topics   Alcohol use: No   Drug use: No     Allergies   Patient has no known allergies.   Review of Systems Review of Systems  As stated above in HPI Physical Exam Triage Vital Signs ED Triage Vitals  Enc Vitals Group     BP 04/21/21 1840 123/74     Pulse Rate 04/21/21 1840 71     Resp 04/21/21 1840 18     Temp 04/21/21 1840 98.4 F (36.9 C)     Temp src --      SpO2 04/21/21 1840 98 %     Weight --       Height --      Head Circumference --      Peak Flow --      Pain Score 04/21/21 1838 2     Pain Loc --      Pain Edu? --      Excl. in GC? --    No data found.  Updated Vital Signs BP 123/74    Pulse 71    Temp 98.4 F (36.9 C)    Resp 18    SpO2 98%   Visual Acuity Right Eye Distance: 20/20 Left Eye Distance: 20/20 Bilateral Distance: 20/20  Right Eye Near:   Left Eye Near:    Bilateral Near:     Physical Exam Vitals and nursing note reviewed.  Constitutional:      Appearance: Normal appearance.  HENT:     Head: Normocephalic and atraumatic.  Eyes:     General: Lids are normal. Vision grossly intact. Gaze aligned appropriately.        Right eye: No foreign body or discharge.        Left eye: No foreign body or discharge.     Extraocular Movements:     Right eye: Normal  extraocular motion.     Left eye: Normal extraocular motion.     Conjunctiva/sclera:     Right eye: Right conjunctiva is not injected. No chemosis, exudate or hemorrhage.    Left eye: Left conjunctiva is not injected. No chemosis, exudate or hemorrhage.     Comments: There is a pin size tan stye like lesion of the right lower eyelid internally.  This does not remove with a Q-tip swab and does not appear sharp.  This was not picked up by fluorescein dye.    Neurological:     Mental Status: He is alert.     UC Treatments / Results  Labs (all labs ordered are listed, but only abnormal results are displayed) Labs Reviewed - No data to display  EKG   Radiology No results found.  Procedures Procedures (including critical care time)  Medications Ordered in UC Medications - No data to display  Initial Impression / Assessment and Plan / UC Course  I have reviewed the triage vital signs and the nursing notes.  Pertinent labs & imaging results that were available during my care of the patient were reviewed by me and considered in my medical decision making (see chart for details).     New.  I  discussed with patient that he appears to have an abrasion from the foreign body. The area under the lower eyelid could be a stye versus other. I have recommended systane eye ointment +/- eye patch. Should improve over the next few days. Discussed red flag signs and symptoms. Follow up with eye specialist as needed.  Final Clinical Impressions(s) / UC Diagnoses   Final diagnoses:  None   Discharge Instructions   None    ED Prescriptions   None    PDMP not reviewed this encounter.   Rushie Chestnut, [place] 04/21/21 1857

## 2023-02-17 DIAGNOSIS — R5383 Other fatigue: Secondary | ICD-10-CM | POA: Diagnosis not present

## 2023-02-17 DIAGNOSIS — Z0000 Encounter for general adult medical examination without abnormal findings: Secondary | ICD-10-CM | POA: Diagnosis not present

## 2023-02-17 DIAGNOSIS — Z131 Encounter for screening for diabetes mellitus: Secondary | ICD-10-CM | POA: Diagnosis not present

## 2023-02-17 DIAGNOSIS — Z13220 Encounter for screening for lipoid disorders: Secondary | ICD-10-CM | POA: Diagnosis not present
